# Patient Record
Sex: Female | Born: 1995 | Race: Black or African American | Hispanic: No | Marital: Single | Smoking: Never smoker
Health system: Southern US, Community
[De-identification: ages and names within clinical notes are randomized; demographics above are authoritative.]

## PROBLEM LIST (undated history)

## (undated) DIAGNOSIS — J302 Other seasonal allergic rhinitis: Secondary | ICD-10-CM

## (undated) DIAGNOSIS — J309 Allergic rhinitis, unspecified: Secondary | ICD-10-CM

## (undated) HISTORY — DX: Allergic rhinitis, unspecified: J30.9

---
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
## 2015-02-06 ENCOUNTER — Ambulatory Visit (INDEPENDENT_AMBULATORY_CARE_PROVIDER_SITE_OTHER): Payer: 59 | Admitting: Internal Medicine

## 2015-02-06 ENCOUNTER — Encounter: Payer: Self-pay | Admitting: Internal Medicine

## 2015-02-06 VITALS — BP 114/78 | HR 90 | Temp 97.6°F | Wt 150.5 lb

## 2015-02-06 DIAGNOSIS — R1012 Left upper quadrant pain: Secondary | ICD-10-CM

## 2015-02-06 DIAGNOSIS — Z0000 Encounter for general adult medical examination without abnormal findings: Secondary | ICD-10-CM

## 2015-02-06 DIAGNOSIS — Z00129 Encounter for routine child health examination without abnormal findings: Principal | ICD-10-CM

## 2015-02-06 MED ORDER — ALBUTEROL SULFATE HFA 108 (90 BASE) MCG/ACT IN AERS: INHALATION_SPRAY | RESPIRATORY_TRACT | Status: DC

## 2015-02-06 NOTE — Progress Notes (Signed)
Subjective:    Patient ID: Wanda Diaz, female    DOB: 10/17/1995, 19 y.o.   MRN: [id number]  HPI Here for wellness and f/u asDr plotnikov not available, and pt going back to school;  Overall doing ok;  Pt denies Chest pain, worsening SOB, DOE, wheezing, orthopnea, PND, worsening LE edema, palpitations, dizziness or syncope.  Pt denies neurological change such as new headache, facial or extremity weakness.  Pt denies polydipsia, polyuria, or low sugar symptoms. Pt states overall good compliance with treatment and medications, good tolerability, and has been trying to follow appropriate diet.  Pt denies worsening depressive symptoms, suicidal ideation or panic. No fever, night sweats, wt loss, loss of appetite, or other constitutional symptoms.  Pt states good ability with ADL's, has low fall risk, home safety reviewed and adequate, no other significant changes in hearing or vision, and only occasionally active with exercise. C/o sharp and dull  intermittent left upper quadrant pain,  No raidation, no fever or n/v or change in bowel habits or blood. Denies urinary symptoms such as dysuria, frequency, urgency, flank pain, hematuria or n/v, fever, chills.  Never sexually active. Due for first pap smear Past Medical History  Diagnosis Date  . Allergic rhinitis, cause unspecified   . Asthma    History reviewed. No pertinent past surgical history.  reports that she has never smoked. She does not have any smokeless tobacco history on file. She reports that she does not drink alcohol or use illicit drugs. family history includes Allergies in her mother; Asthma in her mother; Clotting disorder in her maternal uncle. No Known Allergies Current Outpatient Prescriptions on File Prior to Visit  Medication Sig Dispense Refill  . amitriptyline (ELAVIL) 10 MG tablet Take 10 mg by mouth at bedtime.    . cetirizine (ZYRTEC) 10 MG tablet Take 1 tablet (10 mg total) by mouth daily as needed for allergies. 100  tablet 3  . ibuprofen (ADVIL,MOTRIN) 200 MG tablet Take 200 mg by mouth every 6 (six) hours as needed for moderate pain.     No current facility-administered medications on file prior to visit.     Review of Systems  Constitutional: Negative for increased diaphoresis, other activity, appetite or siginficant weight change other than noted HENT: Negative for worsening hearing loss, ear pain, facial swelling, mouth sores and neck stiffness.   Eyes: Negative for other worsening pain, redness or visual disturbance.  Respiratory: Negative for shortness of breath and wheezing  Cardiovascular: Negative for chest pain and palpitations.  Gastrointestinal: Negative for diarrhea, blood in stool, abdominal distention or other pain Genitourinary: Negative for hematuria, flank pain or change in urine volume.  Musculoskeletal: Negative for myalgias or other joint complaints.  Skin: Negative for color change and wound or drainage.  Neurological: Negative for syncope and numbness. other than noted Hematological: Negative for adenopathy. or other swelling Psychiatric/Behavioral: Negative for hallucinations, SI, self-injury, decreased concentration or other worsening agitation.      Objective:   Physical Exam BP 114/78 mmHg  Pulse 90  Temp(Src) 97.6 F (36.4 C)  Wt 150 lb 8 oz (68.266 kg)  SpO2 98% VS noted,  Constitutional: Pt is oriented to person, place, and time. Appears well-developed and well-nourished, in no significant distress Head: Normocephalic and atraumatic.  Right Ear: External ear normal.  Left Ear: External ear normal.  Nose: Nose normal.  Mouth/Throat: Oropharynx is clear and moist.  Eyes: Conjunctivae and EOM are normal. Pupils are equal, round, and reactive to light.  Neck: Normal range of motion. Neck supple. No JVD present. No tracheal deviation present or significant neck LA or mass Cardiovascular: Normal rate, regular rhythm, normal heart sounds and intact distal pulses.     Pulmonary/Chest: Effort normal and breath sounds without rales or wheezing  Abdominal: Soft. Bowel sounds are normal. NT. No HSM  Musculoskeletal: Normal range of motion. Exhibits no edema.  Lymphadenopathy:  Has no cervical adenopathy.  Neurological: Pt is alert and oriented to person, place, and time. Pt has normal reflexes. No cranial nerve deficit. Motor grossly intact Skin: Skin is warm and dry. No rash noted.  Psychiatric:  Has normal mood and affect. Behavior is normal.      Assessment & Plan:

## 2015-02-06 NOTE — Progress Notes (Signed)
Pre visit review using our clinic review tool, if applicable. No additional management support is needed unless otherwise documented below in the visit note. 

## 2015-02-06 NOTE — Addendum Note (Signed)
Addended by: Tonye Becket on: 02/06/2015 05:31 PM   Modules accepted: Orders, Medications

## 2015-02-06 NOTE — Patient Instructions (Signed)
Please continue all other medications as before, and refills have been done if requested - the inhaler  Please have the pharmacy call with any other refills you may need.  Please continue your efforts at being more active, low cholesterol diet, and weight control.  You are otherwise up to date with prevention measures today, except please consider seeing Dr Steward Ros for your first pap smear evaluation.  Please keep your appointments with your specialists as you may have planned  Please go to the LAB in the Basement (turn left off the elevator) for the tests to be done tomorrow  You will be contacted by phone if any changes need to be made immediately.  Otherwise, you will receive a letter about your results with an explanation, but please check with MyChart first.  Please remember to sign up for MyChart if you have not done so, as this will be important to you in the future with finding out test results, communicating by private email, and scheduling acute appointments online when needed.  Please return in 1 year for your yearly visit, or sooner if needed, with Lab testing done 3-5 days before

## 2015-02-06 NOTE — Assessment & Plan Note (Signed)
etilogy unclear, ? MSK, for lipase as well, but o/w ? Msk,  to f/u any worsening symptoms or concerns

## 2015-02-06 NOTE — Assessment & Plan Note (Signed)

## 2017-01-15 ENCOUNTER — Encounter: Payer: Self-pay | Admitting: Internal Medicine

## 2017-02-04 ENCOUNTER — Encounter: Payer: Self-pay | Admitting: Internal Medicine

## 2017-02-05 ENCOUNTER — Encounter: Payer: Self-pay | Admitting: Internal Medicine

## 2017-02-08 ENCOUNTER — Telehealth: Payer: Self-pay | Admitting: Internal Medicine

## 2017-02-08 NOTE — Telephone Encounter (Signed)
No show fee for 7/6 waived per patient request b/c of [place] in school in [place].

## 2017-06-04 ENCOUNTER — Encounter: Payer: Self-pay | Admitting: Family Medicine

## 2017-06-04 ENCOUNTER — Ambulatory Visit (INDEPENDENT_AMBULATORY_CARE_PROVIDER_SITE_OTHER): Payer: BLUE CROSS/BLUE SHIELD | Admitting: Family Medicine

## 2017-06-04 VITALS — BP 112/70 | HR 88 | Temp 98.9°F | Ht 64.0 in | Wt 146.0 lb

## 2017-06-04 DIAGNOSIS — H66002 Acute suppurative otitis media without spontaneous rupture of ear drum, left ear: Secondary | ICD-10-CM

## 2017-06-04 MED ORDER — AMOXICILLIN 875 MG PO TABS: 875.0000 mg | ORAL_TABLET | Freq: Two times a day (BID) | ORAL | 0 refills | Status: DC

## 2017-06-04 MED ORDER — PROMETHAZINE-DM 6.25-15 MG/5ML PO SYRP: 5.0000 mL | ORAL_SOLUTION | Freq: Four times a day (QID) | ORAL | 0 refills | Status: DC | PRN

## 2017-06-04 NOTE — Progress Notes (Signed)
   Subjective:    Patient ID: Wanda Diaz, female    DOB: [birth year], 21 y.o.   MRN: [id number]  HPI URI- sore throat, laryngitis, 'yellow mucous'.  sxs started 1 week ago.  No fevers.  Had migraine Thursday night but this resolved by Friday.  No sinus pain/pressure.  No N/V.  + nasal congestion.  No ear pain.  No known sick contacts.  + cough- hacking but nonproductive.   Review of Systems For ROS see HPI     Objective:   Physical Exam  Constitutional: She appears well-developed and well-nourished. No distress.  HENT:  Head: Normocephalic and atraumatic.  Right Ear: Tympanic membrane normal.  Left Ear: Tympanic membrane is bulging. A middle ear effusion (TM dull w/ purulent fluid present) is present.  Nose: Mucosal edema and rhinorrhea present. Right sinus exhibits no maxillary sinus tenderness and no frontal sinus tenderness. Left sinus exhibits no maxillary sinus tenderness and no frontal sinus tenderness.  Mouth/Throat: Mucous membranes are normal. Posterior oropharyngeal erythema (w/ PND) present.  Eyes: Conjunctivae and EOM are normal. Pupils are equal, round, and reactive to light.  Neck: Normal range of motion. Neck supple.  Cardiovascular: Normal rate, regular rhythm and normal heart sounds.  Pulmonary/Chest: Effort normal and breath sounds normal. No respiratory distress. She has no wheezes. She has no rales.  Lymphadenopathy:    She has no cervical adenopathy.  Vitals reviewed.         Assessment & Plan:  L otitis media- new.  Pt denies ear pain but has obvious purulent fluid behind L TM.  May be early in infectious course.  Start Amox.  Reviewed supportive care and red flags that should prompt return.  Pt expressed understanding and is in agreement w/ plan.

## 2017-06-04 NOTE — Patient Instructions (Signed)
Follow up as needed or as scheduled Start the Amoxicillin twice daily- take w/ food Drink plenty of fluids Use the cough syrup as needed- may cause drowsiness Mucinex DM for daytime cough/congestion Ibuprofen will help w/ vocal cord inflammation and ear pain REST YOUR VOICE!!! Call with any questions or concerns Hang in there!!!

## 2017-12-22 ENCOUNTER — Ambulatory Visit (INDEPENDENT_AMBULATORY_CARE_PROVIDER_SITE_OTHER): Payer: BLUE CROSS/BLUE SHIELD | Admitting: Internal Medicine

## 2017-12-22 ENCOUNTER — Encounter: Payer: Self-pay | Admitting: Internal Medicine

## 2017-12-22 ENCOUNTER — Other Ambulatory Visit (INDEPENDENT_AMBULATORY_CARE_PROVIDER_SITE_OTHER): Payer: BLUE CROSS/BLUE SHIELD

## 2017-12-22 VITALS — BP 112/76 | HR 73 | Temp 99.1°F | Ht 64.0 in | Wt 144.0 lb

## 2017-12-22 DIAGNOSIS — Z0000 Encounter for general adult medical examination without abnormal findings: Secondary | ICD-10-CM

## 2017-12-22 DIAGNOSIS — J4520 Mild intermittent asthma, uncomplicated: Secondary | ICD-10-CM | POA: Diagnosis not present

## 2017-12-22 DIAGNOSIS — Z23 Encounter for immunization: Secondary | ICD-10-CM | POA: Diagnosis not present

## 2017-12-22 LAB — CBC WITH DIFFERENTIAL/PLATELET
Basophils Absolute: 0.1 10*3/uL (ref 0.0–0.1)
Basophils Relative: 1.4 % (ref 0.0–3.0)
EOS PCT: 10.9 % — AB (ref 0.0–5.0)
Eosinophils Absolute: 0.8 10*3/uL — ABNORMAL HIGH (ref 0.0–0.7)
HCT: 37.9 % (ref 36.0–46.0)
Hemoglobin: 12.4 g/dL (ref 12.0–15.0)
LYMPHS ABS: 1.3 10*3/uL (ref 0.7–4.0)
Lymphocytes Relative: 18.6 % (ref 12.0–46.0)
MCHC: 32.8 g/dL (ref 30.0–36.0)
MCV: 79.7 fl (ref 78.0–100.0)
MONOS PCT: 4 % (ref 3.0–12.0)
Monocytes Absolute: 0.3 10*3/uL (ref 0.1–1.0)
Neutro Abs: 4.5 10*3/uL (ref 1.4–7.7)
Neutrophils Relative %: 65.1 % (ref 43.0–77.0)
Platelets: 290 10*3/uL (ref 150.0–400.0)
RBC: 4.76 Mil/uL (ref 3.87–5.11)
RDW: 14 % (ref 11.5–15.5)
WBC: 6.9 10*3/uL (ref 4.0–10.5)

## 2017-12-22 LAB — LIPID PANEL
Cholesterol: 205 mg/dL — ABNORMAL HIGH (ref 0–200)
HDL: 53.7 mg/dL (ref >39.00)
LDL Cholesterol: 135 mg/dL — ABNORMAL HIGH (ref 0–99)
NonHDL: 150.82
Total CHOL/HDL Ratio: 4
Triglycerides: 79 mg/dL (ref 0.0–149.0)
VLDL: 15.8 mg/dL (ref 0.0–40.0)

## 2017-12-22 LAB — HEPATIC FUNCTION PANEL
ALT: 15 U/L (ref 0–35)
AST: 17 U/L (ref 0–37)
Albumin: 4.3 g/dL (ref 3.5–5.2)
Alkaline Phosphatase: 63 U/L (ref 39–117)
Bilirubin, Direct: 0.1 mg/dL (ref 0.0–0.3)
TOTAL PROTEIN: 7.2 g/dL (ref 6.0–8.3)
Total Bilirubin: 0.5 mg/dL (ref 0.2–1.2)

## 2017-12-22 LAB — URINALYSIS
Bilirubin Urine: NEGATIVE
HGB URINE DIPSTICK: NEGATIVE
Ketones, ur: NEGATIVE
Leukocytes, UA: NEGATIVE
Nitrite: NEGATIVE
PH: 6.5 (ref 5.0–8.0)
Specific Gravity, Urine: 1.015 (ref 1.000–1.030)
TOTAL PROTEIN, URINE-UPE24: NEGATIVE
URINE GLUCOSE: NEGATIVE
Urobilinogen, UA: 0.2 (ref 0.0–1.0)

## 2017-12-22 LAB — BASIC METABOLIC PANEL
BUN: 9 mg/dL (ref 6–23)
CO2: 28 mEq/L (ref 19–32)
Calcium: 9.8 mg/dL (ref 8.4–10.5)
Chloride: 105 mEq/L (ref 96–112)
Creatinine, Ser: 0.82 mg/dL (ref 0.40–1.20)
GFR: 111.88 mL/min (ref >60.00)
Glucose, Bld: 108 mg/dL — ABNORMAL HIGH (ref 70–99)
POTASSIUM: 4.6 meq/L (ref 3.5–5.1)
SODIUM: 141 meq/L (ref 135–145)

## 2017-12-22 LAB — TSH: TSH: 1.42 u[IU]/mL (ref 0.35–4.50)

## 2017-12-22 MED ORDER — VITAMIN D 1000 UNITS PO TABS: 1000.0000 [IU] | ORAL_TABLET | Freq: Every day | ORAL | 11 refills | Status: AC

## 2017-12-22 MED ORDER — ALBUTEROL SULFATE HFA 108 (90 BASE) MCG/ACT IN AERS: INHALATION_SPRAY | RESPIRATORY_TRACT | 5 refills | Status: AC

## 2017-12-22 NOTE — Assessment & Plan Note (Addendum)
We discussed age appropriate health related issues, including available/recomended screening tests and vaccinations. We discussed a need for adhering to healthy diet and exercise. Labs were ordered to be later reviewed . All questions were answered. 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

## 2017-12-22 NOTE — Progress Notes (Signed)
   Subjective:  Patient ID: Wanda Diaz, female    DOB: 1996-05-09  Age: 22 y.o. MRN: [id number]  CC: No chief complaint on file.   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 cetirizine (ZYRTEC) 10 MG tablet Take 1 tablet (10 mg total) by mouth daily as needed for allergies. 100 tablet 3  . ibuprofen (ADVIL,MOTRIN) 200 MG tablet Take 200 mg by mouth every 6 (six) hours as needed for moderate pain.    . promethazine-dextromethorphan (PROMETHAZINE-DM) 6.25-15 MG/5ML syrup Take 5 mLs by mouth 4 (four) times daily as needed. 240 mL 0  . albuterol (VENTOLIN HFA) 108 (90 BASE) MCG/ACT inhaler INHALE 2 PUFFS INTO THE LUNGS EVERY 6 HOURS AS NEEDED FOR WHEEZING 18 g 5  . amoxicillin (AMOXIL) 875 MG tablet Take 1 tablet (875 mg total) by mouth 2 (two) times daily. 20 tablet 0   No facility-administered medications prior to visit.     ROS: Review of Systems  Constitutional: Negative for activity change, appetite change, chills, fatigue and unexpected weight change.  HENT: Negative for congestion, mouth sores and sinus pressure.   Eyes: Negative for visual disturbance.  Respiratory: Negative for cough and chest tightness.   Gastrointestinal: Negative for abdominal pain and nausea.  Genitourinary: Negative for difficulty urinating, frequency and vaginal pain.  Musculoskeletal: Negative for back pain and gait problem.  Skin: Negative for pallor and rash.  Neurological: Negative for dizziness, tremors, weakness, numbness and headaches.  Psychiatric/Behavioral: Negative for confusion and sleep disturbance.    Objective:  BP 112/76 (BP Location: Left Arm, Patient Position: Sitting, Cuff Size: Normal)   Pulse 73   Temp 99.1 F (37.3 C) (Oral)   Ht 5\' 4"  (1.626 m)   Wt 144 lb (65.3 kg)   SpO2 99%   BMI 24.72 kg/m   BP Readings from Last 3 Encounters:    12/22/17 112/76  06/04/17 112/70  02/06/15 114/78    Wt Readings from Last 3 Encounters:  12/22/17 144 lb (65.3 kg)  06/04/17 146 lb (66.2 kg)  02/06/15 150 lb 8 oz (68.3 kg) (81 %, Z= 0.90)*   * Growth percentiles are based on CDC (Girls, 2-20 Years) data.    Physical Exam  Lab Results  Component Value Date   TSH 0.47 09/16/2010    Dg Chest 2 View  Result Date: 10/15/2013 CLINICAL DATA:  Shortness of breath, wheezing.  History of asthma. EXAM: CHEST  2 VIEW COMPARISON:  Prior radiograph from 05/22/2004 FINDINGS: The cardiac and mediastinal silhouettes are stable in size and contour, and remain within normal limits. The lungs are normally inflated. There is mild central airway thickening, likely related to history of asthma. No airspace consolidation, pleural effusion, or pulmonary edema is identified. There is no pneumothorax. No acute osseous abnormality identified. IMPRESSION: Mild central airway thickening, likely related to history of asthma. No focal infiltrates identified. Electronically Signed   By: Rise MuBenjamin  McClintock M.D.   On: 10/15/2013 04:26    Assessment & Plan:   There are no diagnoses linked to this encounter.   No orders of the defined types were placed in this encounter.    Follow-up: No follow-ups on file.  Sonda PrimesAlex Ein Rijo, MD

## 2017-12-22 NOTE — Assessment & Plan Note (Signed)
Ventolin MDI - rare use Zyrtec prn

## 2017-12-23 LAB — RUBEOLA ANTIBODY IGG: Rubeola IgG: 181 AU/mL

## 2019-04-07 ENCOUNTER — Ambulatory Visit: Payer: Self-pay | Admitting: *Deleted

## 2019-04-07 NOTE — Telephone Encounter (Signed)
Unable to reach pt, phone number inactive

## 2019-04-07 NOTE — Telephone Encounter (Addendum)
Summary: ride side abdominal pain    Patient called in stating she has been having on and off right side discomfort for the past 2-3 days. States it is not constant and has only caused her to vomit once. Please advise as she is unsure of what to do at this point and would go to an urgent care if needed. Patient disconnected before could connect with Triage.      Attempted to call this patient twice and got a recording that this number is not a working number. Attempted the other numbers in her chart, no answer. Routing it to [employer] at [facility].

## 2019-10-21 ENCOUNTER — Ambulatory Visit: Payer: Self-pay | Attending: Internal Medicine

## 2019-10-21 DIAGNOSIS — Z23 Encounter for immunization: Secondary | ICD-10-CM

## 2019-10-21 NOTE — Progress Notes (Signed)
   Covid-19 Vaccination Clinic  Name:  Wanda Diaz    MRN: [id number] DOB: 02-08-1996  10/21/2019  Wanda Diaz was observed post Covid-19 immunization for 15 minutes without incident. She was provided with Vaccine Information Sheet and instruction to access the V-Safe system.   Wanda Diaz was instructed to call 911 with any severe reactions post vaccine: Marland Kitchen Difficulty breathing  . Swelling of face and throat  . A fast heartbeat  . A bad rash all over body  . Dizziness and weakness   Immunizations Administered    Name Date Dose VIS Date Route   Pfizer COVID-19 Vaccine 10/21/2019  8:23 AM 0.3 mL 06/23/2019 Intramuscular   Manufacturer: [employer], [employer]   Lot: [id number]   NDC: [address]-1

## 2019-11-13 ENCOUNTER — Ambulatory Visit: Payer: Self-pay | Attending: Internal Medicine

## 2019-11-13 DIAGNOSIS — Z23 Encounter for immunization: Secondary | ICD-10-CM

## 2019-11-13 NOTE — Progress Notes (Signed)
   Covid-19 Vaccination Clinic  Name:  Alexi Dorminey    MRN: [id number] DOB: [birth year]  11/13/2019  Ms. Brousseau was observed post Covid-19 immunization for 15 minutes without incident. She was provided with Vaccine Information Sheet and instruction to access the V-Safe system.   Ms. Gaccione was instructed to call 911 with any severe reactions post vaccine: Marland Kitchen Difficulty breathing  . Swelling of face and throat  . A fast heartbeat  . A bad rash all over body  . Dizziness and weakness   Immunizations Administered    Name Date Dose VIS Date Route   Pfizer COVID-19 Vaccine 11/13/2019  8:18 AM 0.3 mL 09/06/2018 Intramuscular   Manufacturer: [employer], [employer]   Lot: [id number]   NDC: [address]-7

## 2021-05-22 ENCOUNTER — Emergency Department (INDEPENDENT_AMBULATORY_CARE_PROVIDER_SITE_OTHER)
Admission: RE | Admit: 2021-05-22 | Discharge: 2021-05-22 | Disposition: A | Discharge status: Home / Self Care | Payer: Self-pay | Source: Ambulatory Visit

## 2021-05-22 ENCOUNTER — Other Ambulatory Visit: Payer: Self-pay

## 2021-05-22 ENCOUNTER — Other Ambulatory Visit (HOSPITAL_COMMUNITY): Payer: Self-pay

## 2021-05-22 VITALS — BP 109/74 | HR 77 | Temp 98.9°F | Resp 16 | Ht 64.0 in | Wt 149.0 lb

## 2021-05-22 DIAGNOSIS — J0100 Acute maxillary sinusitis, unspecified: Secondary | ICD-10-CM

## 2021-05-22 DIAGNOSIS — J309 Allergic rhinitis, unspecified: Secondary | ICD-10-CM

## 2021-05-22 MED ORDER — CEFDINIR 300 MG PO CAPS
300.0000 mg | ORAL_CAPSULE | Freq: Two times a day (BID) | ORAL | 0 refills | Status: AC
  Filled 2021-05-22: qty 14, 7d supply, fill #0

## 2021-05-22 MED ORDER — FEXOFENADINE HCL 180 MG PO TABS
180.0000 mg | ORAL_TABLET | Freq: Every day | ORAL | 0 refills | Status: AC
  Filled 2021-05-22: qty 15, 15d supply, fill #0

## 2021-05-22 NOTE — Discharge Instructions (Addendum)
Advised/encouraged patient to hold antibiotics for the next 3 to 4 days prior to starting.  Advised patient if starting antibiotic take with food to completion.  Advised patient may take Allegra daily for the next 5 to 7 days for concurrent postnasal drainage/drip and may use as needed afterwards.  Encouraged patient increase daily water intake while taking these medications.

## 2021-05-22 NOTE — ED Triage Notes (Signed)
Cough & runny nose x 3  days  Increased fatigue  Increased use of albuterol inhaler - TID

## 2021-05-22 NOTE — ED Provider Notes (Signed)
[facility]    CSN: [id number] Arrival date & time: 05/22/21  1146      History   Chief Complaint Chief Complaint  Patient presents with   Cough   Nasal Congestion    HPI Wanda Diaz is a 25 y.o. female.   HPI 25 year old female presents with cough and runny nose for 3 days.  Reports increased fatigue and increased use of albuterol inhaler.  History reviewed. No pertinent past medical history.  There are no problems to display for this patient.   History reviewed. No pertinent surgical history.  OB History   No obstetric history on file.      Home Medications    Prior to Admission medications   Medication Sig Start Date End Date Taking? Authorizing Provider  cefdinir (OMNICEF) 300 MG capsule Take 1 capsule (300 mg total) by mouth 2 (two) times daily for 7 days. 05/22/21 05/29/21 Yes Trevor Iha, FNP  fexofenadine [facility] ALLERGY) 180 MG tablet Take 1 tablet (180 mg total) by mouth daily for 15 days. 05/22/21 06/06/21 Yes Trevor Iha, FNP    Family History Family History  Problem Relation Age of Onset   Migraines Mother    Chronic Renal Failure Mother    Hypertension Father     Social History Social History   Tobacco Use   Smoking status: Never    Passive exposure: Never   Smokeless tobacco: Never  Vaping Use   Vaping Use: Never used  Substance Use Topics   Alcohol use: Not Currently   Drug use: Never     Allergies   Patient has no known allergies.   Review of Systems Review of Systems  HENT:  Positive for congestion and rhinorrhea.   All other systems reviewed and are negative.   Physical Exam Triage Vital Signs ED Triage Vitals  Enc Vitals Group     BP 05/22/21 1249 109/74     Pulse Rate 05/22/21 1249 77     Resp 05/22/21 1249 16     Temp 05/22/21 1249 98.9 F (37.2 C)     Temp Source 05/22/21 1249 Oral     SpO2 05/22/21 1249 98 %     Weight 05/22/21 1250 149 lb (67.6 kg)     Height 05/22/21 1250 5\' 4"   (1.626 m)     Head Circumference --      Peak Flow --      Pain Score 05/22/21 1250 0     Pain Loc --      Pain Edu? --      Excl. in GC? --    No data found.  Updated Vital Signs BP 109/74 (BP Location: Left Arm)   Pulse 77   Temp 98.9 F (37.2 C) (Oral)   Resp 16   Ht 5\' 4"  (1.626 m)   Wt 149 lb (67.6 kg)   LMP 04/19/2021 (Exact Date)   SpO2 98%   BMI 25.58 kg/m        Physical Exam Vitals and nursing note reviewed.  Constitutional:      General: She is not in acute distress.    Appearance: Normal appearance. She is normal weight. She is not ill-appearing.  HENT:     Head: Normocephalic and atraumatic.     Right Ear: Tympanic membrane, ear canal and external ear normal.     Left Ear: Tympanic membrane, ear canal and external ear normal.     Mouth/Throat:     Mouth: Mucous membranes are moist.  Pharynx: Oropharynx is clear.     Comments: Moderate amount of clear drainage of posterior oropharynx noted Eyes:     Extraocular Movements: Extraocular movements intact.     Conjunctiva/sclera: Conjunctivae normal.     Pupils: Pupils are equal, round, and reactive to light.  Cardiovascular:     Rate and Rhythm: Normal rate and regular rhythm.     Pulses: Normal pulses.     Heart sounds: Normal heart sounds.  Pulmonary:     Effort: Pulmonary effort is normal.     Breath sounds: Normal breath sounds.  Musculoskeletal:        General: Normal range of motion.     Cervical back: Normal range of motion and neck supple.  Skin:    General: Skin is warm and dry.  Neurological:     General: No focal deficit present.     Mental Status: She is alert and oriented to person, place, and time.     UC Treatments / Results  Labs (all labs ordered are listed, but only abnormal results are displayed) Labs Reviewed - No data to display  EKG   Radiology No results found.  Procedures Procedures (including critical care time)  Medications Ordered in UC Medications - No  data to display  Initial Impression / Assessment and Plan / UC Course  I have reviewed the triage vital signs and the nursing notes.  Pertinent labs & imaging results that were available during my care of the patient were reviewed by me and considered in my medical decision making (see chart for details).     MDM: 1.  Subacute maxillary sinusitis-Rx'd cefdinir; 2.  Allergic rhinitis-Rx'd Allegra. Advised/encouraged patient to hold antibiotics for the next 3 to 4 days prior to starting.  Advised patient if starting antibiotic take with food to completion.  Advised patient may take Allegra daily for the next 5 to 7 days for concurrent postnasal drainage/drip and may use as needed afterwards.  Encouraged patient increase daily water intake while taking these medications. Final Clinical Impressions(s) / UC Diagnoses   Final diagnoses:  Subacute maxillary sinusitis  Allergic rhinitis, unspecified seasonality, unspecified trigger     Discharge Instructions      Advised/encouraged patient to hold antibiotics for the next 3 to 4 days prior to starting.  Advised patient if starting antibiotic take with food to completion.  Advised patient may take Allegra daily for the next 5 to 7 days for concurrent postnasal drainage/drip and may use as needed afterwards.  Encouraged patient increase daily water intake while taking these medications.     ED Prescriptions     Medication Sig Dispense Auth. Provider   cefdinir (OMNICEF) 300 MG capsule Take 1 capsule (300 mg total) by mouth 2 (two) times daily for 7 days. 14 capsule Trevor Iha, FNP   fexofenadine [facility] ALLERGY) 180 MG tablet Take 1 tablet (180 mg total) by mouth daily for 15 days. 15 tablet Trevor Iha, FNP      PDMP not reviewed this encounter.   Trevor Iha, FNP 05/22/21 1324

## 2021-06-18 ENCOUNTER — Ambulatory Visit: Payer: Self-pay | Admitting: Internal Medicine

## 2022-03-13 ENCOUNTER — Ambulatory Visit: Payer: Self-pay | Admitting: Physician Assistant

## 2022-11-20 ENCOUNTER — Other Ambulatory Visit (HOSPITAL_COMMUNITY): Payer: Self-pay

## 2022-11-20 MED ORDER — URSODIOL 300 MG PO CAPS
ORAL_CAPSULE | ORAL | 1 refills | Status: DC
  Filled 2022-11-20: qty 270, 90d supply, fill #0

## 2022-12-29 ENCOUNTER — Other Ambulatory Visit (HOSPITAL_COMMUNITY): Payer: Self-pay

## 2022-12-29 MED ORDER — HYDROXYZINE HCL 25 MG PO TABS
25.0000 mg | ORAL_TABLET | Freq: Four times a day (QID) | ORAL | 1 refills | Status: AC | PRN
  Filled 2022-12-29: qty 30, 8d supply, fill #0

## 2023-02-10 ENCOUNTER — Other Ambulatory Visit (HOSPITAL_COMMUNITY): Payer: Self-pay

## 2023-02-10 MED ORDER — BISACODYL 10 MG RE SUPP
RECTAL | 0 refills | Status: AC
  Filled 2023-02-10: qty 12, 12d supply, fill #0

## 2023-02-10 MED ORDER — IBUPROFEN 800 MG PO TABS
ORAL_TABLET | ORAL | 0 refills | Status: AC
  Filled 2023-02-10: qty 30, 10d supply, fill #0

## 2023-02-10 MED ORDER — ACETAMINOPHEN 500 MG PO TABS
ORAL_TABLET | ORAL | 0 refills | Status: AC
  Filled 2023-02-10: qty 30, 7d supply, fill #0

## 2023-02-10 MED ORDER — POLYETHYLENE GLYCOL 3350 17 GM/SCOOP PO POWD
17.0000 g | Freq: Every day | ORAL | 0 refills | Status: AC
  Filled 2023-02-10: qty 238, 14d supply, fill #0

## 2023-02-10 MED ORDER — DOCUSATE SODIUM 100 MG PO CAPS
ORAL_CAPSULE | ORAL | 0 refills | Status: AC
  Filled 2023-02-10: qty 30, 15d supply, fill #0

## 2023-07-29 ENCOUNTER — Ambulatory Visit: Payer: Self-pay

## 2023-10-31 ENCOUNTER — Other Ambulatory Visit: Payer: Self-pay

## 2023-10-31 ENCOUNTER — Ambulatory Visit
Admission: EM | Admit: 2023-10-31 | Discharge: 2023-10-31 | Disposition: A | Discharge status: Home / Self Care | Attending: Internal Medicine | Admitting: Internal Medicine

## 2023-10-31 DIAGNOSIS — B349 Viral infection, unspecified: Secondary | ICD-10-CM | POA: Diagnosis not present

## 2023-10-31 DIAGNOSIS — J4521 Mild intermittent asthma with (acute) exacerbation: Secondary | ICD-10-CM

## 2023-10-31 MED ORDER — ONDANSETRON 4 MG PO TBDP: 4.0000 mg | ORAL_TABLET | Freq: Three times a day (TID) | ORAL | 0 refills | Status: AC | PRN

## 2023-10-31 MED ORDER — IPRATROPIUM-ALBUTEROL 0.5-2.5 (3) MG/3ML IN SOLN
3.0000 mL | Freq: Once | RESPIRATORY_TRACT | Status: AC
  Administered 2023-10-31: 3 mL via RESPIRATORY_TRACT

## 2023-10-31 MED ORDER — BENZONATATE 100 MG PO CAPS: 100.0000 mg | ORAL_CAPSULE | Freq: Three times a day (TID) | ORAL | 0 refills | Status: AC | PRN

## 2023-10-31 MED ORDER — PREDNISONE 20 MG PO TABS: 40.0000 mg | ORAL_TABLET | Freq: Every day | ORAL | 0 refills | Status: AC

## 2023-10-31 NOTE — ED Notes (Signed)
 Patient reports feeling better post neb with less wheezing. O2 sat 98% on R/A

## 2023-10-31 NOTE — ED Provider Notes (Signed)
 [facility]    CSN: [id number] Arrival date & time: 10/31/23  1139      History   Chief Complaint Chief Complaint  Patient presents with   Emesis    HPI Wanda Diaz is a 28 y.o. female.   Patient presents with approximately 6-day history of nausea, vomiting, body aches, chills, nasal congestion, cough.  Reports history of asthma and has been using albuterol  inhaler with temporary improvement.  Reports chest tightness as well.  She states her asthma rarely flares up.  Denies any fever.  Her mother has had similar symptoms. She has been able to tolerate fluids at times.    Emesis   Past Medical History:  Diagnosis Date   Allergic rhinitis, cause unspecified    Asthma     Patient Active Problem List   Diagnosis Date Noted   Abdominal pain, left upper quadrant 02/06/2015   Well adult exam 12/19/2013   Seizure (HCC) 05/16/2013   Migraine headache 05/16/2013   SINUS TACHYCARDIA 09/16/2010   Wheezing 02/27/2009   Asthma with exacerbation 03/26/2008   Allergic rhinitis 11/23/2007   Asthma, allergic 11/23/2007    History reviewed. No pertinent surgical history.  OB History   No obstetric history on file.      Home Medications    Prior to Admission medications   Medication Sig Start Date End Date Taking? Authorizing Provider  benzonatate  (TESSALON ) 100 MG capsule Take 1 capsule (100 mg total) by mouth every 8 (eight) hours as needed for cough. 10/31/23  Yes Avalon Coppinger, [place]y [place] E, FNP  ondansetron  (ZOFRAN -ODT) 4 MG disintegrating tablet Take 1 tablet (4 mg total) by mouth every 8 (eight) hours as needed for nausea or vomiting. 10/31/23  Yes Clarissa Croon E, FNP  predniSONE  (DELTASONE ) 20 MG tablet Take 2 tablets (40 mg total) by mouth daily for 5 days. 10/31/23 11/05/23 Yes Arwen Haseley, Dewey Fordyce, FNP  acetaminophen  (TYLENOL ) 500 MG tablet Take 1 tablet (500 mg total) by mouth every 6 (six) hours as needed for mild pain (1-3) for up to 10 days. 02/10/23     albuterol   (VENTOLIN  HFA) 108 (90 Base) MCG/ACT inhaler INHALE 2 PUFFS INTO THE LUNGS EVERY 6 HOURS AS NEEDED FOR WHEEZING 12/22/17   Plotnikov, Aleksei V, MD  bisacodyl  (DULCOLAX) 10 MG suppository Insert 1 suppository (10 mg total) into the rectum once as needed for constipation for up to 10 days. 02/10/23     docusate sodium  (COLACE) 100 MG capsule Take 1 capsule (100 mg total) by mouth 2 (two) times a day as needed for constipation. 02/10/23     fexofenadine  (ALLEGRA  ALLERGY) 180 MG tablet Take 1 tablet (180 mg total) by mouth daily for 15 days. 05/22/21 06/06/21  Ragan, Michael, FNP  hydrOXYzine  (ATARAX ) 25 MG tablet Take 1 tablet (25 mg total) by mouth every 6 (six) hours as needed for itching 12/29/22     ibuprofen  (ADVIL ) 800 MG tablet Take 1 tablet (800 mg total) by mouth every 8 (eight) hours as needed for mild pain (1-3) (or uterine cramping) for up to 10 days. 02/10/23     polyethylene glycol powder (GLYCOLAX /MIRALAX ) 17 GM/SCOOP powder Take 17 g by mouth daily for 14 days. 02/10/23       Family History Family History  Problem Relation Age of Onset   Migraines Mother    Chronic Renal Failure Mother    Hypertension Father    Allergies Mother    Asthma Mother    Clotting disorder Maternal Uncle  great uncle    Social History Social History   Tobacco Use   Smoking status: Never    Passive exposure: Never   Smokeless tobacco: Never  Vaping Use   Vaping status: Never Used  Substance Use Topics   Alcohol use: Not Currently   Drug use: Never     Allergies   Patient has no known allergies.   Review of Systems Review of Systems Per HPI  Physical Exam Triage Vital Signs ED Triage Vitals  Encounter Vitals Group     BP 10/31/23 1151 117/72     Systolic BP Percentile --      Diastolic BP Percentile --      Pulse Rate 10/31/23 1151 (!) 110     Resp 10/31/23 1151 16     Temp 10/31/23 1151 98.8 F (37.1 C)     Temp Source 10/31/23 1151 Oral     SpO2 10/31/23 1156 90 %      Weight --      Height --      Head Circumference --      Peak Flow --      Pain Score 10/31/23 1153 4     Pain Loc --      Pain Education --      Exclude from Growth Chart --    No data found.  Updated Vital Signs BP 117/72 (BP Location: Right Arm)   Pulse (!) 110   Temp 98.8 F (37.1 C) (Oral)   Resp 16   LMP 10/26/2023   SpO2 90%   Visual Acuity Right Eye Distance:   Left Eye Distance:   Bilateral Distance:    Right Eye Near:   Left Eye Near:    Bilateral Near:     Physical Exam Constitutional:      General: She is not in acute distress.    Appearance: Normal appearance. She is not toxic-appearing or diaphoretic.  HENT:     Head: Normocephalic and atraumatic.     Right Ear: Tympanic membrane and ear canal normal.     Left Ear: Tympanic membrane and ear canal normal.     Nose: Congestion present.     Mouth/Throat:     Mouth: Mucous membranes are moist.     Pharynx: No posterior oropharyngeal erythema.  Eyes:     Extraocular Movements: Extraocular movements intact.     Conjunctiva/sclera: Conjunctivae normal.     Pupils: Pupils are equal, round, and reactive to light.  Cardiovascular:     Rate and Rhythm: Normal rate and regular rhythm.     Pulses: Normal pulses.     Heart sounds: Normal heart sounds.  Pulmonary:     Effort: Pulmonary effort is normal. No respiratory distress.     Breath sounds: No stridor. Wheezing present. No rhonchi or rales.  Abdominal:     General: Abdomen is flat. Bowel sounds are normal.     Palpations: Abdomen is soft.  Musculoskeletal:        General: Normal range of motion.     Cervical back: Normal range of motion.  Skin:    General: Skin is warm and dry.  Neurological:     General: No focal deficit present.     Mental Status: She is alert and oriented to person, place, and time. Mental status is at baseline.  Psychiatric:        Mood and Affect: Mood normal.        Behavior: Behavior normal.  UC Treatments /  Results  Labs (all labs ordered are listed, but only abnormal results are displayed) Labs Reviewed - No data to display  EKG   Radiology No results found.  Procedures Procedures (including critical care time)  Medications Ordered in UC Medications  ipratropium-albuterol  (DUONEB) 0.5-2.5 (3) MG/3ML nebulizer solution 3 mL (3 mLs Nebulization Given 10/31/23 1217)    Initial Impression / Assessment and Plan / UC Course  I have reviewed the triage vital signs and the nursing notes.  Pertinent labs & imaging results that were available during my care of the patient were reviewed by me and considered in my medical decision making (see chart for details).     Suspect viral illness given known sick contact causing mild asthma exacerbation.  DuoNeb was administered with improvement in lung sounds.  Patient's oxygenation is sustaining at 98% with no tachypnea so patient is safe for discharge.  Will prescribe prednisone  steroid burst and patient encouraged to continue albuterol  inhaler use at home.  Benzonatate  prescribed to take as needed for cough as well.  Ondansetron  prescribed for nausea.  Patient is currently breast-feeding but patient states that she has a freezer supply that she can use until she has completed these medications.  Encouraged her to use freezer supply to avoid its adverse effects with breast-feeding and medication use.  Patient declined COVID and flu testing with shared decision making given duration of symptoms as it would not change treatment.  Encouraged strict return precautions if symptoms persist or worsen.  Patient verbalized understanding and was agreeable with plan. Final Clinical Impressions(s) / UC Diagnoses   Final diagnoses:  Viral illness  Mild intermittent asthma with acute exacerbation   Discharge Instructions   None    ED Prescriptions     Medication Sig Dispense Auth. Provider   predniSONE  (DELTASONE ) 20 MG tablet Take 2 tablets (40 mg total) by  mouth daily for 5 days. 10 tablet Dixon, Tauheed Mcfayden E, FNP   benzonatate  (TESSALON ) 100 MG capsule Take 1 capsule (100 mg total) by mouth every 8 (eight) hours as needed for cough. 21 capsule [place], [place] E, FNP   ondansetron  (ZOFRAN -ODT) 4 MG disintegrating tablet Take 1 tablet (4 mg total) by mouth every 8 (eight) hours as needed for nausea or vomiting. 20 tablet [employer], Sherril Shipman E, [place]      PDMP not reviewed this encounter.   Dodson [place], [place] 10/31/23 1240

## 2023-10-31 NOTE — ED Triage Notes (Signed)
 C/O vomiting, body aches, chills for 6 days. Patient states she vomited once last night and once today. C/O cough and "chest tightness" Patient states she is using albuterol  with some relief.

## 2024-02-01 ENCOUNTER — Ambulatory Visit: Admitting: Internal Medicine

## 8387-03-14 DEATH — deceased
